# Patient Record
Sex: Female | Born: 1995 | Race: Black or African American | Hispanic: No | Marital: Single | Smoking: Never smoker
Health system: Southern US, Community
[De-identification: ages and names within clinical notes are randomized; demographics above are authoritative.]

## PROBLEM LIST (undated history)

## (undated) DIAGNOSIS — R040 Epistaxis: Secondary | ICD-10-CM

## (undated) DIAGNOSIS — Z9109 Other allergy status, other than to drugs and biological substances: Secondary | ICD-10-CM

## (undated) HISTORY — PX: TONSILLECTOMY: SUR1361

## (undated) HISTORY — PX: ADENOIDECTOMY: SUR15

---
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
## 2014-01-29 ENCOUNTER — Emergency Department (HOSPITAL_COMMUNITY): Payer: Medicaid Other

## 2014-01-29 ENCOUNTER — Emergency Department (HOSPITAL_COMMUNITY)
Admission: EM | Admit: 2014-01-29 | Discharge: 2014-01-29 | Disposition: A | Discharge status: Home / Self Care | Payer: Medicaid Other | Attending: Emergency Medicine | Admitting: Emergency Medicine

## 2014-01-29 ENCOUNTER — Encounter (HOSPITAL_COMMUNITY): Payer: Self-pay | Admitting: Emergency Medicine

## 2014-01-29 DIAGNOSIS — R05 Cough: Secondary | ICD-10-CM | POA: Insufficient documentation

## 2014-01-29 DIAGNOSIS — Z79899 Other long term (current) drug therapy: Secondary | ICD-10-CM | POA: Insufficient documentation

## 2014-01-29 DIAGNOSIS — R059 Cough, unspecified: Secondary | ICD-10-CM | POA: Diagnosis not present

## 2014-01-29 DIAGNOSIS — R0789 Other chest pain: Secondary | ICD-10-CM | POA: Diagnosis not present

## 2014-01-29 DIAGNOSIS — R079 Chest pain, unspecified: Secondary | ICD-10-CM | POA: Diagnosis present

## 2014-01-29 DIAGNOSIS — Z3202 Encounter for pregnancy test, result negative: Secondary | ICD-10-CM | POA: Diagnosis not present

## 2014-01-29 LAB — BASIC METABOLIC PANEL
Anion gap: 13 (ref 5–15)
BUN: 12 mg/dL (ref 6–23)
CALCIUM: 9.6 mg/dL (ref 8.4–10.5)
CO2: 23 mEq/L (ref 19–32)
CREATININE: 0.77 mg/dL (ref 0.50–1.10)
Chloride: 103 mEq/L (ref 96–112)
GFR calc Af Amer: >90 mL/min (ref >90)
GLUCOSE: 84 mg/dL (ref 70–99)
Potassium: 3.9 mEq/L (ref 3.7–5.3)
SODIUM: 139 meq/L (ref 137–147)

## 2014-01-29 LAB — CBC WITH DIFFERENTIAL/PLATELET
Basophils Absolute: 0 10*3/uL (ref 0.0–0.1)
Basophils Relative: 0 % (ref 0–1)
EOS ABS: 0.2 10*3/uL (ref 0.0–0.7)
EOS PCT: 3 % (ref 0–5)
HCT: 40.2 % (ref 36.0–46.0)
Hemoglobin: 13.4 g/dL (ref 12.0–15.0)
LYMPHS ABS: 1.9 10*3/uL (ref 0.7–4.0)
Lymphocytes Relative: 32 % (ref 12–46)
MCH: 28.5 pg (ref 26.0–34.0)
MCHC: 33.3 g/dL (ref 30.0–36.0)
MCV: 85.5 fL (ref 78.0–100.0)
MONOS PCT: 10 % (ref 3–12)
Monocytes Absolute: 0.6 10*3/uL (ref 0.1–1.0)
Neutro Abs: 3.2 10*3/uL (ref 1.7–7.7)
Neutrophils Relative %: 55 % (ref 43–77)
PLATELETS: 176 10*3/uL (ref 150–400)
RBC: 4.7 MIL/uL (ref 3.87–5.11)
RDW: 12.2 % (ref 11.5–15.5)
WBC: 5.8 10*3/uL (ref 4.0–10.5)

## 2014-01-29 LAB — D-DIMER, QUANTITATIVE: D-Dimer, Quant: 0.28 ug/mL-FEU (ref 0.00–0.48)

## 2014-01-29 LAB — TROPONIN I: Troponin I: <0.3 ng/mL (ref <0.30)

## 2014-01-29 LAB — POC URINE PREG, ED: PREG TEST UR: NEGATIVE

## 2014-01-29 MED ORDER — IBUPROFEN 400 MG PO TABS: 400.0000 mg | ORAL_TABLET | Freq: Four times a day (QID) | ORAL | Status: DC | PRN

## 2014-01-29 NOTE — Discharge Instructions (Signed)
We saw you in the ER for the chest pain/shortness of breath. All of our cardiac workup is normal, including labs, EKG and chest X-RAY do not show any emergent condition. We are not sure what is causing your discomfort, but we feel comfortable sending you home at this time. The workup in the ER is not complete, and you should follow up with your primary care doctor for further evaluation.   Chest Pain (Nonspecific) It is often hard to give a specific diagnosis for the cause of chest pain. There is always a chance that your pain could be related to something serious, such as a heart attack or a blood clot in the lungs. You need to follow up with your health care provider for further evaluation. CAUSES   Heartburn.  Pneumonia or bronchitis.  Anxiety or stress.  Inflammation around your heart (pericarditis) or lung (pleuritis or pleurisy).  A blood clot in the lung.  A collapsed lung (pneumothorax). It can develop suddenly on its own (spontaneous pneumothorax) or from trauma to the chest.  Shingles infection (herpes zoster virus). The chest wall is composed of bones, muscles, and cartilage. Any of these can be the source of the pain.  The bones can be bruised by injury.  The muscles or cartilage can be strained by coughing or overwork.  The cartilage can be affected by inflammation and become sore (costochondritis). DIAGNOSIS  Lab tests or other studies may be needed to find the cause of your pain. Your health care provider may have you take a test called an ambulatory electrocardiogram (ECG). An ECG records your heartbeat patterns over a 24-hour period. You may also have other tests, such as:  Transthoracic echocardiogram (TTE). During echocardiography, sound waves are used to evaluate how blood flows through your heart.  Transesophageal echocardiogram (TEE).  Cardiac monitoring. This allows your health care provider to monitor your heart rate and rhythm in real time.  Holter  monitor. This is a portable device that records your heartbeat and can help diagnose heart arrhythmias. It allows your health care provider to track your heart activity for several days, if needed.  Stress tests by exercise or by giving medicine that makes the heart beat faster. TREATMENT   Treatment depends on what may be causing your chest pain. Treatment may include:  Acid blockers for heartburn.  Anti-inflammatory medicine.  Pain medicine for inflammatory conditions.  Antibiotics if an infection is present.  You may be advised to change lifestyle habits. This includes stopping smoking and avoiding alcohol, caffeine, and chocolate.  You may be advised to keep your head raised (elevated) when sleeping. This reduces the chance of acid going backward from your stomach into your esophagus. Most of the time, nonspecific chest pain will improve within 2-3 days with rest and mild pain medicine.  HOME CARE INSTRUCTIONS   If antibiotics were prescribed, take them as directed. Finish them even if you start to feel better.  For the next few days, avoid physical activities that bring on chest pain. Continue physical activities as directed.  Do not use any tobacco products, including cigarettes, chewing tobacco, or electronic cigarettes.  Avoid drinking alcohol.  Only take medicine as directed by your health care provider.  Follow your health care provider's suggestions for further testing if your chest pain does not go away.  Keep any follow-up appointments you made. If you do not go to an appointment, you could develop lasting (chronic) problems with pain. If there is any problem keeping an appointment,  call to reschedule. SEEK MEDICAL CARE IF:   Your chest pain does not go away, even after treatment.  You have a rash with blisters on your chest.  You have a fever. SEEK IMMEDIATE MEDICAL CARE IF:   You have increased chest pain or pain that spreads to your arm, neck, jaw, back, or  abdomen.  You have shortness of breath.  You have an increasing cough, or you cough up blood.  You have severe back or abdominal pain.  You feel nauseous or vomit.  You have severe weakness.  You faint.  You have chills. This is an emergency. Do not wait to see if the pain will go away. Get medical help at once. Call your local emergency services (911 in [place] Do not drive yourself to the hospital. MAKE SURE YOU:   Understand these instructions.  Will watch your condition.  Will get help right away if you are not doing well or get worse. Document Released: 02/19/2005 Document Revised: 05/17/2013 Document Reviewed: 12/16/2007 [facility] Patient Information 2015 [place], [place]. This information is not intended to replace advice given to you by your health care provider. Make sure you discuss any questions you have with your health care provider.

## 2014-01-29 NOTE — ED Notes (Signed)
Patient transported to X-ray 

## 2014-01-29 NOTE — ED Notes (Signed)
Patient returned from X-ray 

## 2014-01-29 NOTE — ED Notes (Signed)
Pt reports mid chest pains last night and non productive cough yesterday. Having headache this am. No distress noted at triage. ekg done at triage.

## 2014-01-29 NOTE — ED Provider Notes (Addendum)
CSN: [id number]     Arrival date & time 01/29/14  0940 History   First MD Initiated Contact with Patient 01/29/14 [phone]     Chief Complaint  Patient presents with  . Chest Pain  . Cough     (Consider location/radiation/quality/duration/timing/severity/associated sxs/prior Treatment) HPI Comments: Pt comes in with cc of chest pain and non productive cough. Pain is sharp, intermittent, lasting for a few minutes and unprovoked, with no aggravating or relieving facts. Pain is left sided, non radiating. + cough. No dib, diaphoresis, exertional dyspnea. No hx of PE, DVT, and she has no risk factors for the same.   Patient is a 18 y.o. female presenting with chest pain and cough. The history is provided by the patient.  Chest Pain Associated symptoms: cough   Associated symptoms: no fever and no shortness of breath   Cough Associated symptoms: chest pain   Associated symptoms: no chills, no fever, no rash, no shortness of breath and no sore throat     Past Medical History  Diagnosis Date  . Nosebleed   . Environmental allergies    Past Surgical History  Procedure Laterality Date  . Tonsillectomy    . Adenoidectomy     Family History  Problem Relation Age of Onset  . Asthma Other   . Hypertension Other   . Cancer Other   . Asthma Mother    History  Substance Use Topics  . Smoking status: Never Smoker   . Smokeless tobacco: Never Used  . Alcohol Use: No   OB History   Grav Para Term Preterm Abortions TAB SAB Ect Mult Living       Review of Systems  Constitutional: Negative for fever and chills.  HENT: Negative for sore throat.   Respiratory: Positive for cough. Negative for shortness of breath.   Cardiovascular: Positive for chest pain.  Musculoskeletal: Negative for arthralgias.  Skin: Negative for rash.      Allergies  Other  Home Medications   Prior to Admission medications   Medication Sig Start Date End Date Taking? Authorizing  Provider  EPINEPHrine (EPIPEN IJ) Inject 1 Device as directed as needed (Emergent allergy--plants, animal fur).   Yes Historical Provider, MD  ibuprofen (ADVIL,MOTRIN) 200 MG tablet Take 400 mg by mouth every 6 (six) hours as needed.   Yes Historical Provider, MD  Multiple Vitamin (MULTIVITAMIN WITH MINERALS) TABS tablet Take 1 tablet by mouth daily.   Yes Historical Provider, MD  etonogestrel (IMPLANON) 68 MG IMPL implant Inject 1 each into the skin once. Implanted April, 2015 (lasts 3 years)    Historical Provider, MD  ibuprofen (ADVIL,MOTRIN) 400 MG tablet Take 1 tablet (400 mg total) by mouth every 6 (six) hours as needed. 01/29/14   Latravia Southgate, MD   BP 116/72  Pulse 73  Temp(Src) 97.8 F (36.6 C) (Oral)  Resp 18  SpO2 100% Physical Exam  Nursing note and vitals reviewed. Constitutional: She is oriented to person, place, and time. She appears well-developed and well-nourished.  HENT:  Head: Normocephalic and atraumatic.  Eyes: EOM are normal. Pupils are equal, round, and reactive to light.  Neck: Neck supple.  Cardiovascular: Normal rate, regular rhythm and normal heart sounds.   No murmur heard. Pulmonary/Chest: Effort normal. No respiratory distress.  Abdominal: Soft. She exhibits no distension. There is no tenderness. There is no rebound and no guarding.  Neurological: She is alert and oriented to person, place, and  time.  Skin: Skin is warm and dry.    ED Course  Procedures (including critical care time) Labs Review Labs Reviewed  CBC WITH DIFFERENTIAL  BASIC METABOLIC PANEL  TROPONIN I  D-DIMER, QUANTITATIVE  POC URINE PREG, ED    Imaging Review Dg Chest 2 View  01/29/2014   CLINICAL DATA:  Productive cough for 1 day with shortness of breath.  EXAM: CHEST  2 VIEW  COMPARISON:  None.  FINDINGS: Midline trachea. Normal heart size and mediastinal contours. No pleural effusion or pneumothorax. Clear lungs.  IMPRESSION: No acute cardiopulmonary disease.   Electronically  Signed   By: Jeronimo Greaves M.D.   On: 01/29/2014 10:59     EKG Interpretation   Date/Time:  Sunday January 29 2014 09:45:03 EDT Ventricular Rate:  69 PR Interval:  122 QRS Duration: 92 QT Interval:  370 QTC Calculation: 396 R Axis:   92 Text Interpretation:  Normal sinus rhythm with sinus arrhythmia Rightward  axis Early repolarization Borderline ECG No acute changes Confirmed by  Rhunette Croft, MD, Janey Genta [phone]) on 01/29/2014 9:57:49 AM      MDM   Final diagnoses:  Atypical chest pain    Differential diagnosis includes: ACS syndrome CHF exacerbation Valvular disorder Myocarditis Pericarditis Pericardial effusion Pneumonia Pleural effusion Pulmonary edema PE Musculoskeletal pain  Pt with intermittent chest pains. She has EKG findings of early repo, but with right axis some evidence of right sided heart strain. No new ekg changes. Has had chest pain and dib in the past as well. Pt is not PERC neg, as implanon is estrogen based and thus we will get a DIMER on her to screen for DVT.   Derwood Kaplan, MD 01/29/14 1114  Derwood Kaplan, MD 01/29/14 1156

## 2014-02-21 ENCOUNTER — Emergency Department (HOSPITAL_COMMUNITY)
Admission: EM | Admit: 2014-02-21 | Discharge: 2014-02-21 | Disposition: A | Discharge status: Home / Self Care | Payer: Medicaid Other | Attending: Emergency Medicine | Admitting: Emergency Medicine

## 2014-02-21 DIAGNOSIS — N898 Other specified noninflammatory disorders of vagina: Secondary | ICD-10-CM | POA: Diagnosis not present

## 2014-02-21 DIAGNOSIS — Z3202 Encounter for pregnancy test, result negative: Secondary | ICD-10-CM | POA: Diagnosis not present

## 2014-02-21 DIAGNOSIS — Z79899 Other long term (current) drug therapy: Secondary | ICD-10-CM | POA: Insufficient documentation

## 2014-02-21 DIAGNOSIS — N899 Noninflammatory disorder of vagina, unspecified: Secondary | ICD-10-CM | POA: Insufficient documentation

## 2014-02-21 DIAGNOSIS — N949 Unspecified condition associated with female genital organs and menstrual cycle: Secondary | ICD-10-CM | POA: Insufficient documentation

## 2014-02-21 LAB — URINALYSIS, ROUTINE W REFLEX MICROSCOPIC
Bilirubin Urine: NEGATIVE
Glucose, UA: NEGATIVE mg/dL
Hgb urine dipstick: NEGATIVE
Ketones, ur: NEGATIVE mg/dL
LEUKOCYTES UA: NEGATIVE
Nitrite: NEGATIVE
PH: 7.5 (ref 5.0–8.0)
PROTEIN: NEGATIVE mg/dL
Specific Gravity, Urine: 1.015 (ref 1.005–1.030)
Urobilinogen, UA: 1 mg/dL (ref 0.0–1.0)

## 2014-02-21 LAB — WET PREP, GENITAL
Clue Cells Wet Prep HPF POC: NONE SEEN
Trich, Wet Prep: NONE SEEN
Yeast Wet Prep HPF POC: NONE SEEN

## 2014-02-21 LAB — POC URINE PREG, ED: PREG TEST UR: NEGATIVE

## 2014-02-21 LAB — HIV ANTIBODY (ROUTINE TESTING W REFLEX): HIV 1&2 Ab, 4th Generation: NONREACTIVE

## 2014-02-21 LAB — RPR

## 2014-02-21 NOTE — ED Notes (Signed)
Pt with c/o vaginal irritation and discharge for 2 weeks, after unprotected sex. Whitish discharge, itching, no odor per patient

## 2014-02-21 NOTE — ED Provider Notes (Signed)
CSN: [id number]     Arrival date & time 02/21/14  4098 History   First MD Initiated Contact with Patient 02/21/14 0840     Chief Complaint  Patient presents with  . Vaginal Itching   Patient is a 18 y.o. female presenting with vaginal itching. The history is provided by the patient.  Vaginal Itching This is a new problem. The current episode started 1 to 4 weeks ago. The problem occurs constantly. The problem has been unchanged. Pertinent negatives include no abdominal pain, chest pain, fever, headaches, nausea, neck pain, rash, sore throat or vomiting. Nothing aggravates the symptoms. She has tried nothing for the symptoms.   Patient is a 18 yo AAF who presents with vaginal itching and irritation x 2 weeks. The patient is sexually active with one female partner and denies a h/o STI. She reports white vaginal bleeding every other day. Denies vaginal bleeding, dysuria, hematuria, fever, abdominal pain, N/V/D. LMP was approx 1 month ago and was normal for her. She has an implanon that was placed in April 2015.   Past Medical History  Diagnosis Date  . Nosebleed   . Environmental allergies    Past Surgical History  Procedure Laterality Date  . Tonsillectomy    . Adenoidectomy     Family History  Problem Relation Age of Onset  . Asthma Other   . Hypertension Other   . Cancer Other   . Asthma Mother    History  Substance Use Topics  . Smoking status: Never Smoker   . Smokeless tobacco: Never Used  . Alcohol Use: No   OB History   Grav Para Term Preterm Abortions TAB SAB Ect Mult Living   0 0 0 0 0 0 0 0 0 0      Review of Systems  Constitutional: Negative for fever.  HENT: Negative for rhinorrhea and sore throat.   Eyes: Negative for visual disturbance.  Respiratory: Negative for chest tightness and shortness of breath.   Cardiovascular: Negative for chest pain and palpitations.  Gastrointestinal: Negative for nausea, vomiting, abdominal pain and constipation.  Genitourinary:  Positive for vaginal discharge and vaginal pain. Negative for dysuria, hematuria, flank pain, menstrual problem and dyspareunia.       Vaginal itching  Musculoskeletal: Negative for back pain and neck pain.  Skin: Negative for rash.  Neurological: Negative for dizziness and headaches.  Psychiatric/Behavioral: Negative for confusion.  All other systems reviewed and are negative.  Allergies  Other  Home Medications   Prior to Admission medications   Medication Sig Start Date End Date Taking? Authorizing Provider  acetaminophen (TYLENOL) 500 MG tablet Take 1,000 mg by mouth every 8 (eight) hours as needed for headache.   Yes Historical Provider, MD  etonogestrel (IMPLANON) 68 MG IMPL implant Inject 1 each into the skin once. Implanted April, 2015 (lasts 3 years)   Yes Historical Provider, MD  Multiple Vitamin (MULTIVITAMIN WITH MINERALS) TABS tablet Take 1 tablet by mouth daily.   Yes Historical Provider, MD  PRESCRIPTION MEDICATION Apply 1 application topically as needed (vaginal irritation).   Yes Historical Provider, MD  EPINEPHrine (EPIPEN IJ) Inject 1 Device as directed as needed (Emergent allergy--plants, animal fur).    Historical Provider, MD   BP 111/55  Temp(Src) 98.6 F (37 C) (Oral)  Resp 17  SpO2 100% Physical Exam  Constitutional: She is oriented to person, place, and time. She appears well-developed and well-nourished. No distress.  HENT:  Head: Normocephalic and atraumatic.  Mouth/Throat: Oropharynx is clear  and moist.  Eyes: EOM are normal. Pupils are equal, round, and reactive to light.  Neck: Neck supple. No JVD present.  Cardiovascular: Normal rate, regular rhythm, normal heart sounds and intact distal pulses.  Exam reveals no gallop.   No murmur heard. Pulmonary/Chest: Effort normal and breath sounds normal. She has no wheezes. She has no rales.  Abdominal: Soft. She exhibits no distension. There is no tenderness.  Genitourinary: Vagina normal. There is no rash  or lesion on the right labia. There is no rash or lesion on the left labia. Cervix exhibits no motion tenderness. Discharge: thin yellow, thick white. Right adnexum displays no tenderness. Left adnexum displays no tenderness. No bleeding around the vagina.  Musculoskeletal: Normal range of motion. She exhibits no tenderness.  Neurological: She is alert and oriented to person, place, and time. No cranial nerve deficit. She exhibits normal muscle tone.  Skin: Skin is warm and dry. No rash noted.  Psychiatric: Her behavior is normal.   ED Course  Procedures  None   Labs Review Labs Reviewed  WET PREP, GENITAL - Abnormal; Notable for the following:    WBC, Wet Prep HPF POC FEW (*)    All other components within normal limits  GC/CHLAMYDIA PROBE AMP  RPR  HIV ANTIBODY (ROUTINE TESTING)  URINALYSIS, ROUTINE W REFLEX MICROSCOPIC  POC URINE PREG, ED    MDM   Final diagnoses:  Vaginal itching  Vaginal discharge   18 yo AAF presents with vaginal bleeding. AF, VSS, well appearing on exam. No dysuria, hematuria or abnormal vagianl bleeding. Abdomen soft, nontender. +Cervical discharge (white, yellow), no adnexal TTP, no CMT. Doubt PID, TOA, ovarian torsion, UTI/pyelo.UPT negative. Wet prep neg for yeast/trich/clue cells. Sent for HIV and RPR. UA unremarkable.  Instructed patient on safe sex practices and instructed her to f/u with her PCP and GYN. Patient voiced understanding and is agreeable with this plan.    Case discussed with Dr. Rubin PayorPickering.  Maris BergerJonah Zigmund Linse, MD   Maris BergerJonah Chizuko Trine, MD 02/21/14 [phone]

## 2014-02-22 LAB — GC/CHLAMYDIA PROBE AMP
CT PROBE, AMP APTIMA: NEGATIVE
GC Probe RNA: NEGATIVE

## 2014-02-22 NOTE — ED Provider Notes (Signed)
I saw and evaluated the patient, reviewed the resident's note and I agree with the findings and plan.   EKG Interpretation None     Vaginal itching. Pelvic done.   Juliet RudeNathan R. Rubin PayorPickering, MD 02/22/14 [phone]

## 2014-03-07 ENCOUNTER — Emergency Department (HOSPITAL_COMMUNITY)
Admission: EM | Admit: 2014-03-07 | Discharge: 2014-03-07 | Disposition: A | Discharge status: Home / Self Care | Payer: Medicaid Other | Attending: Emergency Medicine | Admitting: Emergency Medicine

## 2014-03-07 ENCOUNTER — Encounter (HOSPITAL_COMMUNITY): Payer: Self-pay | Admitting: Emergency Medicine

## 2014-03-07 ENCOUNTER — Emergency Department (HOSPITAL_COMMUNITY): Payer: Medicaid Other

## 2014-03-07 DIAGNOSIS — L739 Follicular disorder, unspecified: Secondary | ICD-10-CM | POA: Insufficient documentation

## 2014-03-07 DIAGNOSIS — Z3202 Encounter for pregnancy test, result negative: Secondary | ICD-10-CM | POA: Insufficient documentation

## 2014-03-07 DIAGNOSIS — R300 Dysuria: Secondary | ICD-10-CM | POA: Insufficient documentation

## 2014-03-07 DIAGNOSIS — R0602 Shortness of breath: Secondary | ICD-10-CM | POA: Insufficient documentation

## 2014-03-07 DIAGNOSIS — Z79899 Other long term (current) drug therapy: Secondary | ICD-10-CM | POA: Insufficient documentation

## 2014-03-07 DIAGNOSIS — N939 Abnormal uterine and vaginal bleeding, unspecified: Secondary | ICD-10-CM | POA: Diagnosis not present

## 2014-03-07 DIAGNOSIS — Z792 Long term (current) use of antibiotics: Secondary | ICD-10-CM | POA: Diagnosis not present

## 2014-03-07 DIAGNOSIS — R079 Chest pain, unspecified: Secondary | ICD-10-CM | POA: Diagnosis present

## 2014-03-07 LAB — BASIC METABOLIC PANEL
ANION GAP: 11 (ref 5–15)
BUN: 11 mg/dL (ref 6–23)
CALCIUM: 9.3 mg/dL (ref 8.4–10.5)
CO2: 25 mEq/L (ref 19–32)
Chloride: 104 mEq/L (ref 96–112)
Creatinine, Ser: 0.77 mg/dL (ref 0.50–1.10)
Glucose, Bld: 72 mg/dL (ref 70–99)
POTASSIUM: 4 meq/L (ref 3.7–5.3)
Sodium: 140 mEq/L (ref 137–147)

## 2014-03-07 LAB — URINALYSIS, ROUTINE W REFLEX MICROSCOPIC
Bilirubin Urine: NEGATIVE
Glucose, UA: NEGATIVE mg/dL
Ketones, ur: 15 mg/dL — AB
Leukocytes, UA: NEGATIVE
Nitrite: NEGATIVE
Protein, ur: NEGATIVE mg/dL
Specific Gravity, Urine: 1.026 (ref 1.005–1.030)
Urobilinogen, UA: 1 mg/dL (ref 0.0–1.0)
pH: 6 (ref 5.0–8.0)

## 2014-03-07 LAB — CBC
HCT: 39.3 % (ref 36.0–46.0)
HEMOGLOBIN: 13.1 g/dL (ref 12.0–15.0)
MCH: 27.8 pg (ref 26.0–34.0)
MCHC: 33.3 g/dL (ref 30.0–36.0)
MCV: 83.3 fL (ref 78.0–100.0)
PLATELETS: 213 10*3/uL (ref 150–400)
RBC: 4.72 MIL/uL (ref 3.87–5.11)
RDW: 12 % (ref 11.5–15.5)
WBC: 4.5 10*3/uL (ref 4.0–10.5)

## 2014-03-07 LAB — I-STAT TROPONIN, ED: TROPONIN I, POC: 0 ng/mL (ref 0.00–0.08)

## 2014-03-07 LAB — URINE MICROSCOPIC-ADD ON

## 2014-03-07 LAB — WET PREP, GENITAL
Clue Cells Wet Prep HPF POC: NONE SEEN
Trich, Wet Prep: NONE SEEN
Yeast Wet Prep HPF POC: NONE SEEN

## 2014-03-07 LAB — PREGNANCY, URINE: Preg Test, Ur: NEGATIVE

## 2014-03-07 MED ORDER — MUPIROCIN CALCIUM 2 % EX CREA: 1.0000 "application " | TOPICAL_CREAM | Freq: Two times a day (BID) | CUTANEOUS | Status: AC

## 2014-03-07 NOTE — ED Notes (Signed)
Pt from home for eval of multiple complaints, pt states HA that started x1 week ago, pt states she has seen her eye doctor and is suppose to be getting new glasses. Pt also reports cp that started today, no radiation, pt denies and emesis or diarrhea. Pt also reports vaginal irritation that pt was recently seen for and reports is not getting any better. Pt denies any sexual intercourse currently. nad noted. Lung sounds clear.

## 2014-03-07 NOTE — Discharge Instructions (Signed)

## 2014-03-07 NOTE — ED Provider Notes (Addendum)
CSN: [id number]     Arrival date & time 03/07/14  1734 History   First MD Initiated Contact with Patient 03/07/14 1956     Chief Complaint  Patient presents with  . Chest Pain     (Consider location/radiation/quality/duration/timing/severity/associated sxs/prior Treatment) HPI Comments: Patient presents with chest pain and vaginal pain. She reports since yesterday she's had some intermittent tightness to the left side of her chest. She denies any shortness of breath. She says her abdomen is been hurting all day. On further questioning she's had nausea but no abdominal pain or vomiting. She denies any diarrhea. She denies he fevers or chills. She denies any diaphoresis or dizziness. She does have some burning around her vaginal area when she urinates. She denies any vaginal discharge. She is sexually active and has no history of STDs.  Patient is a 18 y.o. female presenting with chest pain.  Chest Pain Associated symptoms: nausea and shortness of breath   Associated symptoms: no abdominal pain, no back pain, no cough, no diaphoresis, no dizziness, no fatigue, no fever, no headache, no numbness, not vomiting and no weakness     Past Medical History  Diagnosis Date  . Nosebleed   . Environmental allergies    Past Surgical History  Procedure Laterality Date  . Tonsillectomy    . Adenoidectomy     Family History  Problem Relation Age of Onset  . Asthma Other   . Hypertension Other   . Cancer Other   . Asthma Mother    History  Substance Use Topics  . Smoking status: Never Smoker   . Smokeless tobacco: Never Used  . Alcohol Use: No   OB History   Grav Para Term Preterm Abortions TAB SAB Ect Mult Living   0 0 0 0 0 0 0 0 0 0      Review of Systems  Constitutional: Negative for fever, chills, diaphoresis and fatigue.  HENT: Negative for congestion, rhinorrhea and sneezing.   Eyes: Negative.   Respiratory: Positive for shortness of breath. Negative for cough and chest  tightness.   Cardiovascular: Positive for chest pain. Negative for leg swelling.  Gastrointestinal: Positive for nausea. Negative for vomiting, abdominal pain, diarrhea and blood in stool.  Genitourinary: Positive for dysuria and vaginal pain. Negative for frequency, hematuria, flank pain and difficulty urinating.  Musculoskeletal: Negative for arthralgias and back pain.  Skin: Negative for rash.  Neurological: Negative for dizziness, speech difficulty, weakness, numbness and headaches.      Allergies  Other  Home Medications   Prior to Admission medications   Medication Sig Start Date End Date Taking? Authorizing Provider  acetaminophen (TYLENOL) 500 MG tablet Take 1,000 mg by mouth every 8 (eight) hours as needed for headache.   Yes Historical Provider, MD  hydrOXYzine (VISTARIL) 25 MG capsule Take 25 mg by mouth every 6 (six) hours as needed for itching.   Yes Historical Provider, MD  ibuprofen (ADVIL,MOTRIN) 400 MG tablet Take 400 mg by mouth every 6 (six) hours as needed.   Yes Historical Provider, MD  Multiple Vitamin (MULTIVITAMIN WITH MINERALS) TABS tablet Take 1 tablet by mouth daily.   Yes Historical Provider, MD  EPINEPHrine (EPIPEN IJ) Inject 1 Device as directed as needed (Emergent allergy--plants, animal fur).    Historical Provider, MD  etonogestrel (IMPLANON) 68 MG IMPL implant Inject 1 each into the skin once. Implanted April, 2015 (lasts 3 years)    Historical Provider, MD  mupirocin cream (BACTROBAN) 2 % Apply 1 application  topically 2 (two) times daily. 03/07/14   Rolan BuccoMelanie Kesley Mullens, MD   BP 110/69  Pulse 73  Temp(Src) 98.4 F (36.9 C) (Oral)  Resp 18  SpO2 95% Physical Exam  Constitutional: She is oriented to person, place, and time. She appears well-developed and well-nourished.  HENT:  Head: Normocephalic and atraumatic.  Eyes: Pupils are equal, round, and reactive to light.  Neck: Normal range of motion. Neck supple.  Cardiovascular: Normal rate, regular  rhythm and normal heart sounds.   Pulmonary/Chest: Effort normal and breath sounds normal. No respiratory distress. She has no wheezes. She has no rales. She exhibits no tenderness.  Abdominal: Soft. Bowel sounds are normal. There is no tenderness. There is no rebound and no guarding.  Genitourinary: No vaginal discharge found.  Small amount of vaginal bleeding.  No discharge.  No CMT or adnexal tenderness.  No skin lesions, sores, or vesicles.  Musculoskeletal: Normal range of motion. She exhibits no edema.  Lymphadenopathy:    She has no cervical adenopathy.  Neurological: She is alert and oriented to person, place, and time.  Skin: Skin is warm and dry. No rash noted.  Psychiatric: She has a normal mood and affect.    ED Course  Procedures (including critical care time) Labs Review Labs Reviewed  WET PREP, GENITAL - Abnormal; Notable for the following:    WBC, Wet Prep HPF POC FEW (*)    All other components within normal limits  URINALYSIS, ROUTINE W REFLEX MICROSCOPIC - Abnormal; Notable for the following:    Hgb urine dipstick LARGE (*)    Ketones, ur 15 (*)    All other components within normal limits  GC/CHLAMYDIA PROBE AMP  CBC  BASIC METABOLIC PANEL  PREGNANCY, URINE  URINE MICROSCOPIC-ADD ON  Rosezena SensorI-STAT TROPOININ, ED    Imaging Review Dg Chest 2 View  03/07/2014   CLINICAL DATA:  Chest pain for 3 days.  EXAM: CHEST  2 VIEW  COMPARISON:  01/29/2014.  FINDINGS: Cardiopericardial silhouette within normal limits. Mediastinal contours normal. Trachea midline. No airspace disease or effusion.  IMPRESSION: No active cardiopulmonary disease.   Electronically Signed   By: Andreas NewportGeoffrey  Lamke M.D.   On: 03/07/2014 18:57     EKG Interpretation   Date/Time:  Tuesday March 07 2014 17:47:52 EDT Ventricular Rate:  73 PR Interval:  116 QRS Duration: 92 QT Interval:  376 QTC Calculation: 414 R Axis:   89 Text Interpretation:  Normal sinus rhythm with sinus arrhythmia Normal ECG   since last tracing no significant change Confirmed by Toshiro Hanken  MD, Sary Bogie  ([id number]) on 03/07/2014 8:14:10 PM      MDM   Final diagnoses:  Folliculitis    Pt is well appearing.  She is eating [employer]-fil[employer]a in the room.  No abd pain on exam.  preg negative.  Pelvic exam unremarkable.  She complains of burning to her perineal area.  I don't see evidence of Herpes, no yeast, no vaginal discharge.  There is maybe a small amount of folliculitis where she has been shaving.  Advised her to stop shaving there and gave her a rx for bactroban ointment.  Advised pt to f/u with her PMD if symptoms not improving.  Her EKG does not show arrhythmia.  Her CXR is unremarkable.  She has no CP currently.    Rolan BuccoMelanie Elber Galyean, MD 03/07/14 [id number]  Rolan BuccoMelanie Marializ Ferrebee, MD 03/07/14 2308

## 2014-03-07 NOTE — ED Notes (Signed)
MD and Toni Amendourtney, NT at the bedside preforming pelvic exam.

## 2014-03-07 NOTE — ED Notes (Signed)
Pt reports chest tightness over the past 2 days with some nausea. No other symptoms. No hx of asthma. Also reports some perineal burning when she voids and it runs down.

## 2014-03-08 LAB — GC/CHLAMYDIA PROBE AMP
CT Probe RNA: NEGATIVE
GC Probe RNA: NEGATIVE

## 2014-04-29 ENCOUNTER — Emergency Department (HOSPITAL_COMMUNITY)
Admission: EM | Admit: 2014-04-29 | Discharge: 2014-04-29 | Disposition: A | Discharge status: Home / Self Care | Payer: Medicaid Other | Attending: Emergency Medicine | Admitting: Emergency Medicine

## 2014-04-29 ENCOUNTER — Encounter (HOSPITAL_COMMUNITY): Payer: Self-pay | Admitting: Emergency Medicine

## 2014-04-29 DIAGNOSIS — Z79899 Other long term (current) drug therapy: Secondary | ICD-10-CM | POA: Diagnosis not present

## 2014-04-29 DIAGNOSIS — Z3202 Encounter for pregnancy test, result negative: Secondary | ICD-10-CM | POA: Insufficient documentation

## 2014-04-29 DIAGNOSIS — N390 Urinary tract infection, site not specified: Secondary | ICD-10-CM | POA: Diagnosis not present

## 2014-04-29 DIAGNOSIS — N898 Other specified noninflammatory disorders of vagina: Secondary | ICD-10-CM | POA: Diagnosis present

## 2014-04-29 LAB — WET PREP, GENITAL
Trich, Wet Prep: NONE SEEN
Yeast Wet Prep HPF POC: NONE SEEN

## 2014-04-29 LAB — URINALYSIS, ROUTINE W REFLEX MICROSCOPIC
Bilirubin Urine: NEGATIVE
Glucose, UA: NEGATIVE mg/dL
KETONES UR: NEGATIVE mg/dL
Nitrite: NEGATIVE
Protein, ur: NEGATIVE mg/dL
SPECIFIC GRAVITY, URINE: 1.024 (ref 1.005–1.030)
Urobilinogen, UA: 1 mg/dL (ref 0.0–1.0)
pH: 6 (ref 5.0–8.0)

## 2014-04-29 LAB — URINE MICROSCOPIC-ADD ON

## 2014-04-29 LAB — POC URINE PREG, ED: PREG TEST UR: NEGATIVE

## 2014-04-29 MED ORDER — CEPHALEXIN 500 MG PO CAPS: 1000.0000 mg | ORAL_CAPSULE | Freq: Two times a day (BID) | ORAL | Status: AC

## 2014-04-29 NOTE — Discharge Instructions (Signed)
Asymptomatic Bacteriuria Asymptomatic bacteriuria is the presence of a large number of bacteria in your urine without the usual symptoms of burning or frequent urination. The following conditions increase the risk of asymptomatic bacteriuria:  Diabetes mellitus.  Advanced age.  Pregnancy in the first trimester.  Kidney stones.  Kidney transplants.  Leaky kidney tube valve in young children (reflux). Treatment for this condition is not needed in most people and can lead to other problems such as too much yeast and growth of resistant bacteria. However, some people, such as pregnant women, do need treatment to prevent kidney infection. Asymptomatic bacteriuria in pregnancy is also associated with fetal growth restriction, premature labor, and newborn death. HOME CARE INSTRUCTIONS Monitor your condition for any changes. The following actions may help to relieve any discomfort you are feeling:  Drink enough water and fluids to keep your urine clear or pale yellow. Go to the bathroom more often to keep your bladder empty.  Keep the area around your vagina and rectum clean. Wipe yourself from front to back after urinating. SEEK IMMEDIATE MEDICAL CARE IF:  You develop signs of an infection such as:  Burning with urination.  Frequency of voiding.  Back pain.  Fever.  You have blood in the urine.  You develop a fever. MAKE SURE YOU:  Understand these instructions.  Will watch your condition.  Will get help right away if you are not doing well or get worse. Document Released: 05/12/2005 Document Revised: 09/26/2013 Document Reviewed: 11/01/2012 [employer] Patient Information 2015 [employer]. This information is not intended to replace advice given to you by your health care provider. Make sure you discuss any questions you have with your health care provider.  

## 2014-04-29 NOTE — ED Provider Notes (Signed)
CSN: [id number]     Arrival date & time 04/29/14  0945 History   First MD Initiated Contact with Patient 04/29/14 [id number]     Chief Complaint  Patient presents with  . Vaginal Discharge  . Vaginal Itching     (Consider location/radiation/quality/duration/timing/severity/associated sxs/prior Treatment) HPI   18 year old female presents with complaints of vaginal discomfort. Patient reports for the past 4 days she has had vaginal irritation including itchiness and burning sensation that has been persistent. There is no associated fever, chills, abdominal pain, back pain, dysuria, hematuria, rash or any significant vaginal discharge. No prior history of STD. Patient states she is currently not sexually active. She also reported having similar symptoms like this several weeks ago and was treated with medication that did provide relief. She denies douching or any change in soap or detergent. She cannot recall her last menstrual period she has an Implanon.  Past Medical History  Diagnosis Date  . Nosebleed   . Environmental allergies    Past Surgical History  Procedure Laterality Date  . Tonsillectomy    . Adenoidectomy     Family History  Problem Relation Age of Onset  . Asthma Other   . Hypertension Other   . Cancer Other   . Asthma Mother    History  Substance Use Topics  . Smoking status: Never Smoker   . Smokeless tobacco: Never Used  . Alcohol Use: No   OB History    Gravida Para Term Preterm AB TAB SAB Ectopic Multiple Living   0 0 0 0 0 0 0 0 0 0      Review of Systems  Constitutional: Negative for fever.  Gastrointestinal: Negative for abdominal pain.  Genitourinary: Positive for genital sores. Negative for dysuria and vaginal discharge.  Skin: Negative for rash.      Allergies  Other  Home Medications   Prior to Admission medications   Medication Sig Start Date End Date Taking? Authorizing Provider  acetaminophen (TYLENOL) 500 MG tablet Take 1,000 mg by  mouth every 8 (eight) hours as needed for headache.    Historical Provider, MD  EPINEPHrine (EPIPEN IJ) Inject 1 Device as directed as needed (Emergent allergy--plants, animal fur).    Historical Provider, MD  etonogestrel (IMPLANON) 68 MG IMPL implant Inject 1 each into the skin once. Implanted April, 2015 (lasts 3 years)    Historical Provider, MD  hydrOXYzine (VISTARIL) 25 MG capsule Take 25 mg by mouth every 6 (six) hours as needed for itching.    Historical Provider, MD  ibuprofen (ADVIL,MOTRIN) 400 MG tablet Take 400 mg by mouth every 6 (six) hours as needed.    Historical Provider, MD  Multiple Vitamin (MULTIVITAMIN WITH MINERALS) TABS tablet Take 1 tablet by mouth daily.    Historical Provider, MD  mupirocin cream (BACTROBAN) 2 % Apply 1 application topically 2 (two) times daily. 03/07/14   Rolan BuccoMelanie Belfi, MD   BP 109/60 mmHg  Pulse 87  Temp(Src) 98.4 F (36.9 C) (Oral)  Resp 16  Ht 5' (1.524 m)  Wt 122 lb (55.339 kg)  BMI 23.83 kg/m2  SpO2 100% Physical Exam  Constitutional: She appears well-developed and well-nourished. No distress.  HENT:  Head: Atraumatic.  Eyes: Conjunctivae are normal.  Neck: Neck supple.  Genitourinary:  Chaperone present:  No inguinal lymphadenopathy, normal external genitalia, no significant discomfort with speculum insertion. Vaginal vault with moderate amount of white discharge, so the cause is visualized and closed. On bimanual examination, no adnexal tenderness and no cervical  motion tenderness.  Neurological: She is alert.  Skin: No rash noted.  Psychiatric: She has a normal mood and affect.  Nursing note and vitals reviewed.   ED Course  Procedures (including critical care time)  12:39 PM Patient complaining of vaginal irritation and occasional urinary discomfort. Wet prep shows moderate WBC and few clue cells. No evidence of yeast infection and no evidence to support bacterial vaginosis. No evidence to suggest PID, Prevacid test is  negative. UA shows blood and urine and evidence of a bacterial infection. Given the symptoms, patient will be treated for urinary tract infection. GC and chlamydia culture sent patient will be notified if tested positive. On reexamination patient has no abdominal tenderness and she felt comfortable being discharged. Return precautions discussed.  Labs Review Labs Reviewed  WET PREP, GENITAL - Abnormal; Notable for the following:    Clue Cells Wet Prep HPF POC FEW (*)    WBC, Wet Prep HPF POC MODERATE (*)    All other components within normal limits  URINALYSIS, ROUTINE W REFLEX MICROSCOPIC - Abnormal; Notable for the following:    APPearance CLOUDY (*)    Hgb urine dipstick LARGE (*)    Leukocytes, UA LARGE (*)    All other components within normal limits  URINE MICROSCOPIC-ADD ON - Abnormal; Notable for the following:    Squamous Epithelial / LPF MANY (*)    Bacteria, UA MANY (*)    All other components within normal limits  GC/CHLAMYDIA PROBE AMP  POC URINE PREG, ED    Imaging Review No results found.   EKG Interpretation None      MDM   Final diagnoses:  UTI (lower urinary tract infection)    BP 115/71 mmHg  Pulse 69  Temp(Src) 98.4 F (36.9 C) (Oral)  Resp 15  Ht 5' (1.524 m)  Wt 122 lb (55.339 kg)  BMI 23.83 kg/m2  SpO2 100%     Fayrene HelperBowie Brenley Priore, PA-C 04/29/14 1240  Flint MelterElliott L Wentz, MD 04/29/14 1751

## 2014-04-29 NOTE — ED Notes (Signed)
Pt. Stated, I've had some itching and burning since Wednesday in my vagina

## 2014-05-01 LAB — GC/CHLAMYDIA PROBE AMP
CT Probe RNA: NEGATIVE
GC PROBE AMP APTIMA: NEGATIVE

## 2014-07-04 ENCOUNTER — Emergency Department (HOSPITAL_COMMUNITY)
Admission: EM | Admit: 2014-07-04 | Discharge: 2014-07-04 | Disposition: A | Discharge status: Home / Self Care | Payer: Medicaid Other | Attending: Emergency Medicine | Admitting: Emergency Medicine

## 2014-07-04 ENCOUNTER — Encounter (HOSPITAL_COMMUNITY): Payer: Self-pay | Admitting: Emergency Medicine

## 2014-07-04 DIAGNOSIS — Z792 Long term (current) use of antibiotics: Secondary | ICD-10-CM | POA: Diagnosis not present

## 2014-07-04 DIAGNOSIS — B373 Candidiasis of vulva and vagina: Secondary | ICD-10-CM | POA: Insufficient documentation

## 2014-07-04 DIAGNOSIS — Z793 Long term (current) use of hormonal contraceptives: Secondary | ICD-10-CM | POA: Insufficient documentation

## 2014-07-04 DIAGNOSIS — Z3202 Encounter for pregnancy test, result negative: Secondary | ICD-10-CM | POA: Diagnosis not present

## 2014-07-04 DIAGNOSIS — Z711 Person with feared health complaint in whom no diagnosis is made: Secondary | ICD-10-CM

## 2014-07-04 DIAGNOSIS — B3731 Acute candidiasis of vulva and vagina: Secondary | ICD-10-CM

## 2014-07-04 DIAGNOSIS — Z202 Contact with and (suspected) exposure to infections with a predominantly sexual mode of transmission: Secondary | ICD-10-CM | POA: Insufficient documentation

## 2014-07-04 LAB — WET PREP, GENITAL
CLUE CELLS WET PREP: NONE SEEN
Trich, Wet Prep: NONE SEEN

## 2014-07-04 LAB — URINALYSIS, ROUTINE W REFLEX MICROSCOPIC
Bilirubin Urine: NEGATIVE
GLUCOSE, UA: NEGATIVE mg/dL
Ketones, ur: NEGATIVE mg/dL
NITRITE: NEGATIVE
Protein, ur: NEGATIVE mg/dL
Specific Gravity, Urine: 1.019 (ref 1.005–1.030)
Urobilinogen, UA: 1 mg/dL (ref 0.0–1.0)
pH: 5.5 (ref 5.0–8.0)

## 2014-07-04 LAB — URINE MICROSCOPIC-ADD ON

## 2014-07-04 LAB — POC URINE PREG, ED: Preg Test, Ur: NEGATIVE

## 2014-07-04 MED ORDER — CEFTRIAXONE SODIUM 250 MG IJ SOLR
250.0000 mg | Freq: Once | INTRAMUSCULAR | Status: AC
  Administered 2014-07-04: 250 mg via INTRAMUSCULAR
  Filled 2014-07-04: qty 250

## 2014-07-04 MED ORDER — AZITHROMYCIN 250 MG PO TABS
1000.0000 mg | ORAL_TABLET | Freq: Once | ORAL | Status: AC
  Administered 2014-07-04: 1000 mg via ORAL
  Filled 2014-07-04: qty 4

## 2014-07-04 MED ORDER — FLUCONAZOLE 100 MG PO TABS
150.0000 mg | ORAL_TABLET | Freq: Once | ORAL | Status: AC
  Administered 2014-07-04: 150 mg via ORAL
  Filled 2014-07-04: qty 2

## 2014-07-04 NOTE — Discharge Instructions (Signed)
[facility]D concerns or screenings. This is the recommendation by the CDC for people with multiple sexual partners or hx of STDs. You have been treated for gonorrhea and chlamydia in the ER but the hospital will call you if lab is positive. You were tested for HIV and Syphilis, and the hospital will call you if the lab is positive. You were found to have yeast infection of the vagina but you were treated today and don't need further treatment.

## 2014-07-04 NOTE — ED Provider Notes (Signed)
CSN: [id number]     Arrival date & time 07/04/14  1114 History  This chart was scribed for [name], PA-C working with Rolland PorterMark James, MD by Elveria Risingimelie Horne, ED Scribe. This patient was seen in room TR11C/TR11C and the patient's care was started at 2:07 PM.   Chief Complaint  Patient presents with  . Exposure to STD   Patient is a 19 y.o. female presenting with STD exposure. The history is provided by the patient. No language interpreter was used.  Exposure to STD This is a new problem. The current episode started more than 1 week ago. The problem occurs constantly. The problem has not changed since onset.Pertinent negatives include no chest pain, no abdominal pain and no shortness of breath. Nothing aggravates the symptoms. Nothing relieves the symptoms. She has tried nothing for the symptoms. The treatment provided no relief.   HPI Comments: Morgan Wiley is a 19 y.o. healthy female who presents to the Emergency Department requesting STD screening for suspected STD exposure. Patient states that her sexual partner informed her that she may been exposed, because he's recently tested positive. Patient is currently asymptomatic; patient specifically denies vaginal pain, vaginal discharge, vaginal itching, vaginal lesions, dysuria, hematuria, abdominal pain, or other symptoms. Patient reports unprotected sexual intercourse with a single female partner. Patient reports that her period began today therefore she just began having vaginal bleeding.    Past Medical History  Diagnosis Date  . Nosebleed   . Environmental allergies    Past Surgical History  Procedure Laterality Date  . Tonsillectomy    . Adenoidectomy     Family History  Problem Relation Age of Onset  . Asthma Other   . Hypertension Other   . Cancer Other   . Asthma Mother    History  Substance Use Topics  . Smoking status: Never Smoker   . Smokeless tobacco: Never Used  . Alcohol Use: No   OB History    Gravida Para  Term Preterm AB TAB SAB Ectopic Multiple Living   0 0 0 0 0 0 0 0 0 0      Review of Systems  Constitutional: Negative for fever and chills.  Respiratory: Negative for shortness of breath.   Cardiovascular: Negative for chest pain.  Gastrointestinal: Negative for nausea, vomiting, abdominal pain, diarrhea and constipation.  Genitourinary: Positive for vaginal bleeding (on menses). Negative for dysuria, hematuria, vaginal discharge, vaginal pain and pelvic pain.  Musculoskeletal: Negative for myalgias and back pain.  Skin: Negative for rash.  Allergic/Immunologic: Negative for immunocompromised state.  Neurological: Negative for weakness and numbness.  A complete 10 system review of systems was obtained and all systems are negative except as noted in the HPI and PMH.    Allergies  Other  Home Medications   Prior to Admission medications   Medication Sig Start Date End Date Taking? Authorizing Provider  acetaminophen (TYLENOL) 500 MG tablet Take 1,000 mg by mouth every 8 (eight) hours as needed for headache.    Historical Provider, MD  cephALEXin (KEFLEX) 500 MG capsule Take 2 capsules (1,000 mg total) by mouth 2 (two) times daily. 04/29/14   Fayrene HelperBowie Tran, PA-C  EPINEPHrine (EPIPEN IJ) Inject 1 Device as directed as needed (Emergent allergy--plants, animal fur).    Historical Provider, MD  etonogestrel (IMPLANON) 68 MG IMPL implant Inject 1 each into the skin once. Implanted April, 2015 (lasts 3 years)    Historical Provider, MD  hydrOXYzine (VISTARIL) 25 MG capsule Take 25 mg by mouth  every 6 (six) hours as needed for itching.    Historical Provider, MD  ibuprofen (ADVIL,MOTRIN) 400 MG tablet Take 400 mg by mouth every 6 (six) hours as needed.    Historical Provider, MD  Multiple Vitamin (MULTIVITAMIN WITH MINERALS) TABS tablet Take 1 tablet by mouth daily.    Historical Provider, MD  mupirocin cream (BACTROBAN) 2 % Apply 1 application topically 2 (two) times daily. Patient not taking:  Reported on 04/29/2014 03/07/14   Rolan Bucco, MD   Triage Vitals: BP 113/75 mmHg  Pulse 76  Temp(Src) 98.1 F (36.7 C) (Oral)  Resp 19  SpO2 100% Physical Exam  Constitutional: She is oriented to person, place, and time. Vital signs are normal. She appears well-developed and well-nourished.  Non-toxic appearance. No distress.  Afebrile nontoxic NAD  HENT:  Head: Normocephalic and atraumatic.  Mouth/Throat: Mucous membranes are normal.  Eyes: Conjunctivae and EOM are normal. Right eye exhibits no discharge. Left eye exhibits no discharge.  Neck: Normal range of motion. Neck supple.  Cardiovascular: Normal rate.   Pulmonary/Chest: Effort normal. No respiratory distress.  Abdominal: Soft. Normal appearance and bowel sounds are normal. She exhibits no distension. There is no tenderness. There is no rigidity, no rebound, no guarding, no CVA tenderness, no tenderness at McBurney's point and negative Murphy's sign.  Soft, NTND, +BS throughout, no r/g/r, neg murphy's, neg mcburney's, no CVA TTP   Genitourinary: Uterus normal. Pelvic exam was performed with patient supine. There is no rash, tenderness or lesion on the right labia. There is no rash, tenderness or lesion on the left labia. Cervix exhibits discharge (mild clear discharge at os). Cervix exhibits no motion tenderness and no friability. Right adnexum displays no mass, no tenderness and no fullness. Left adnexum displays no mass, no tenderness and no fullness. There is bleeding in the vagina. No erythema or tenderness in the vagina. No vaginal discharge found.  Chaperone present for exam. No rashes, lesions, or tenderness to external genitalia. No erythema, injury, or tenderness to vaginal mucosa. No vaginal discharge, menstrual bleeding within vaginal vault. No adnexal masses, tenderness, or fullness. No CMT or cervical friability. Mild clear discharge from cervical os. Uterus non-deviated, mobile, nonTTP, and without enlargement.     Musculoskeletal: Normal range of motion.  Neurological: She is alert and oriented to person, place, and time. She has normal strength. No sensory deficit.  Skin: Skin is warm, dry and intact. No rash noted.  Psychiatric: She has a normal mood and affect. Her behavior is normal.  Nursing note and vitals reviewed.   ED Course  Procedures (including critical care time)  COORDINATION OF CARE: 2:18 PM- Discussed treatment plan with patient at bedside and patient agreed to plan.   Labs Review Labs Reviewed  WET PREP, GENITAL - Abnormal; Notable for the following:    Yeast Wet Prep HPF POC FEW (*)    WBC, Wet Prep HPF POC TOO NUMEROUS TO COUNT (*)    All other components within normal limits  URINALYSIS, ROUTINE W REFLEX MICROSCOPIC - Abnormal; Notable for the following:    Hgb urine dipstick LARGE (*)    Leukocytes, UA MODERATE (*)    All other components within normal limits  URINE MICROSCOPIC-ADD ON  RPR  HIV ANTIBODY (ROUTINE TESTING)  POC URINE PREG, ED  GC/CHLAMYDIA PROBE AMP ([place])    Imaging Review No results found.   EKG Interpretation None      MDM   Final diagnoses:  Concern about STD in female  without diagnosis  Yeast infection of the vagina    19 y.o. female with c/o STD exposure. No symptoms. Just started menses now therefore having vaginal bleeding but no discharge. Pelvic exam unremarkable, mild discharge which could be physiologic. No CMT or adnexal tenderness. Will empirically treat for STDs since she reports exposure. Will reassess shortly.  3:37 PM Wet prep showing few yeas and TNTC WBC. Will give diflucan now. U/A with some leuks but no bacteria and 0-2 WBC, doubt UTI. Discussed f[facility] for future STD concerns. Discussed that hospital will call for positive results. I explained the diagnosis and have given explicit precautions to return to the ER including for any other new or worsening symptoms. The patient  understands and accepts the medical plan as it's been dictated and I have answered their questions. Discharge instructions concerning home care and prescriptions have been given. The patient is STABLE and is discharged to home in good condition.   I personally performed the services described in this documentation, which was scribed in my presence. The recorded information has been reviewed and is accurate.  BP 113/75 mmHg  Pulse 76  Temp(Src) 98.1 F (36.7 C) (Oral)  Resp 19  SpO2 100%  Meds ordered this encounter  Medications  . azithromycin (ZITHROMAX) tablet 1,000 mg    Sig:    And  . cefTRIAXone (ROCEPHIN) injection 250 mg    Sig:     Order Specific Question:  Antibiotic Indication:    Answer:  STD  . fluconazole (DIFLUCAN) tablet 150 mg    Sig:       [place], PA-C 07/04/14 1547  Rolland Porter, MD 07/08/14 2205

## 2014-07-04 NOTE — ED Notes (Signed)
Pt on menstrual period.  

## 2014-07-04 NOTE — ED Notes (Signed)
Pt unable to void 

## 2014-07-04 NOTE — ED Notes (Signed)
Pt requesting STD check; pt sts told by sexual partner that she may have been exposed; pt denies any complaints

## 2014-07-05 LAB — HIV ANTIBODY (ROUTINE TESTING W REFLEX): HIV SCREEN 4TH GENERATION: NONREACTIVE

## 2014-07-05 LAB — RPR: RPR Ser Ql: NONREACTIVE

## 2014-07-05 LAB — GC/CHLAMYDIA PROBE AMP (~~LOC~~) NOT AT ARMC
Chlamydia: NEGATIVE
Neisseria Gonorrhea: NEGATIVE

## 2014-07-11 ENCOUNTER — Telehealth (HOSPITAL_COMMUNITY): Payer: Self-pay

## 2014-10-14 IMAGING — CR DG CHEST 2V
2 series · 2 of 2 positions shown · non-contrast
Comparison: 01/29/2014.

CLINICAL DATA: Chest pain for 3 days.

EXAM:
CHEST  2 VIEW

[w chest pa]
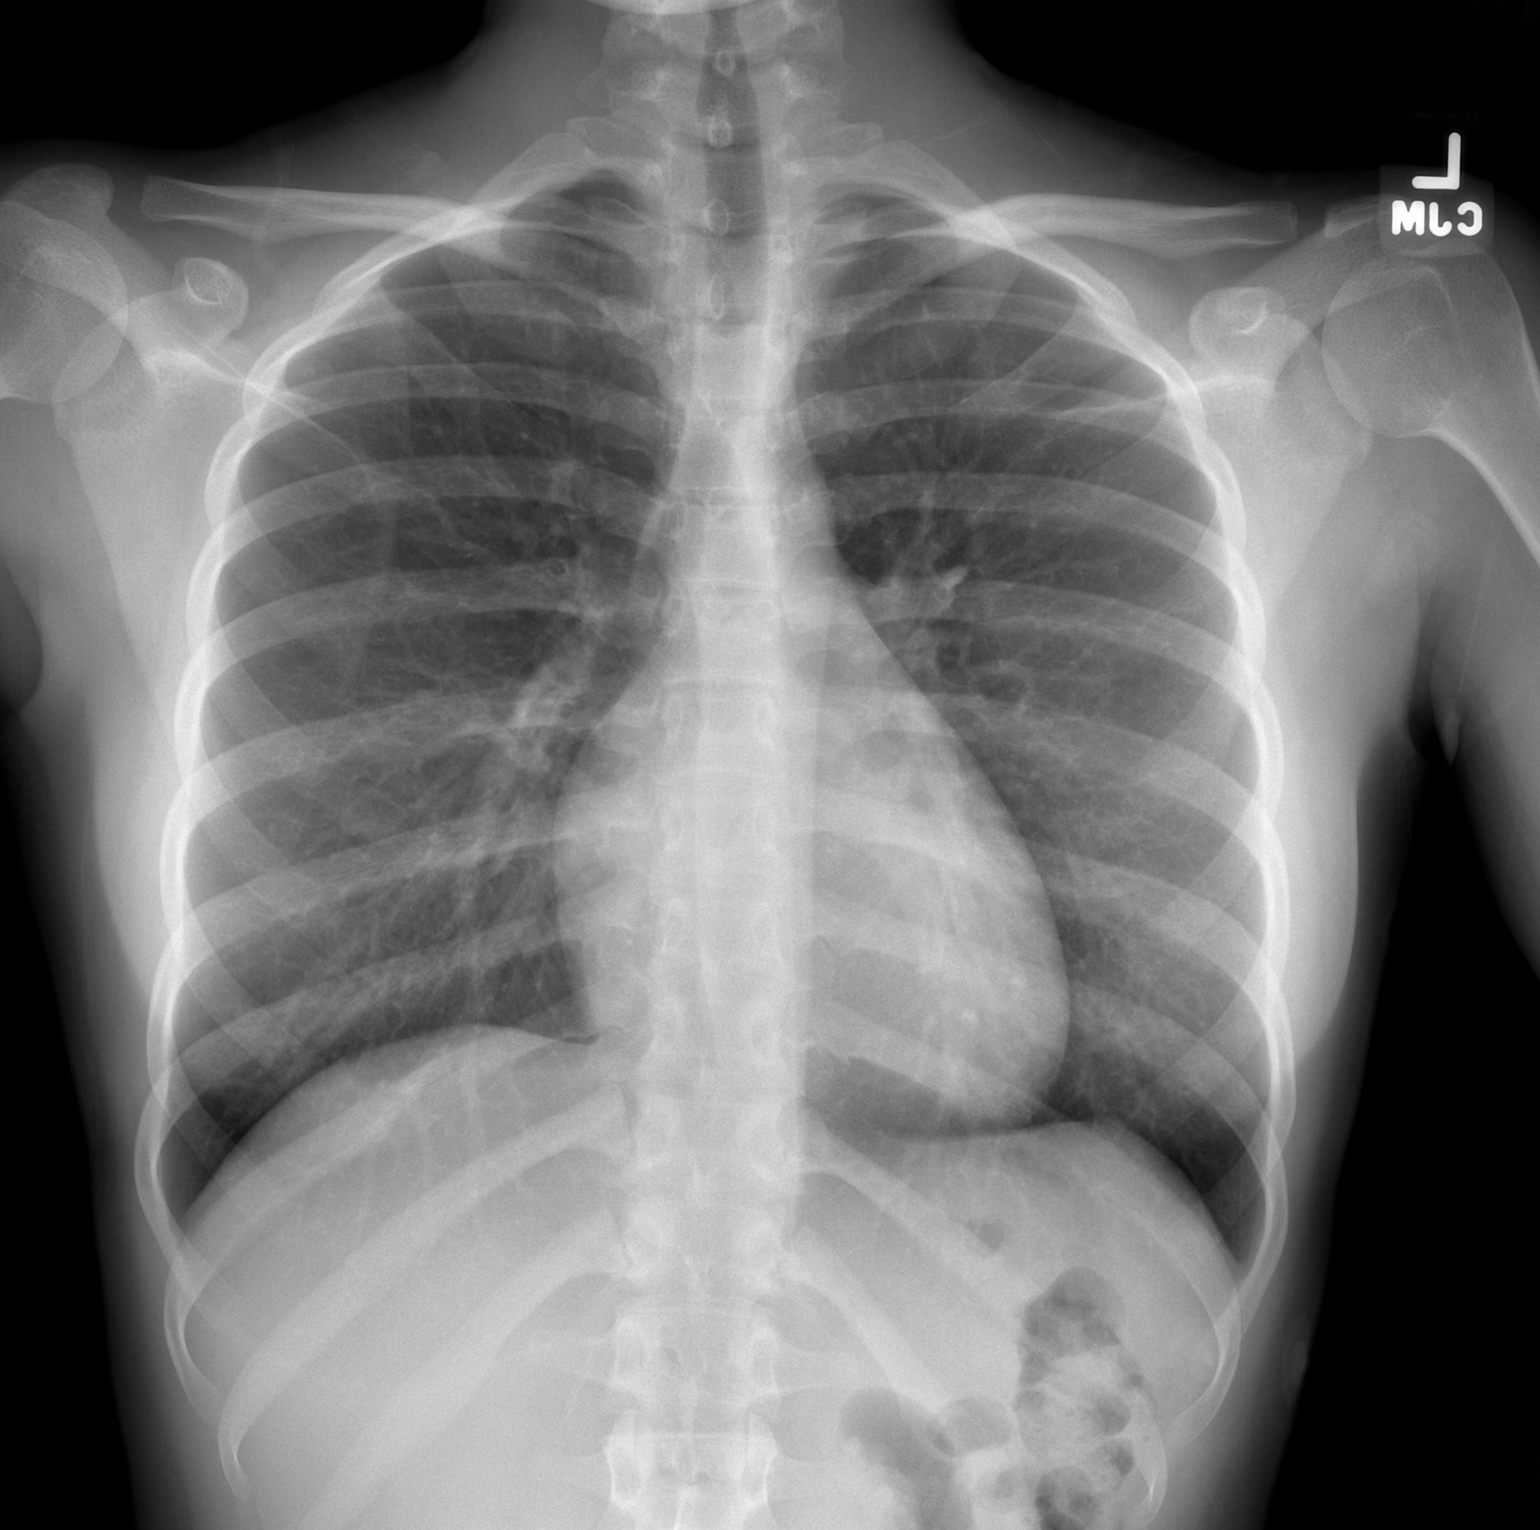

[w chest lat]
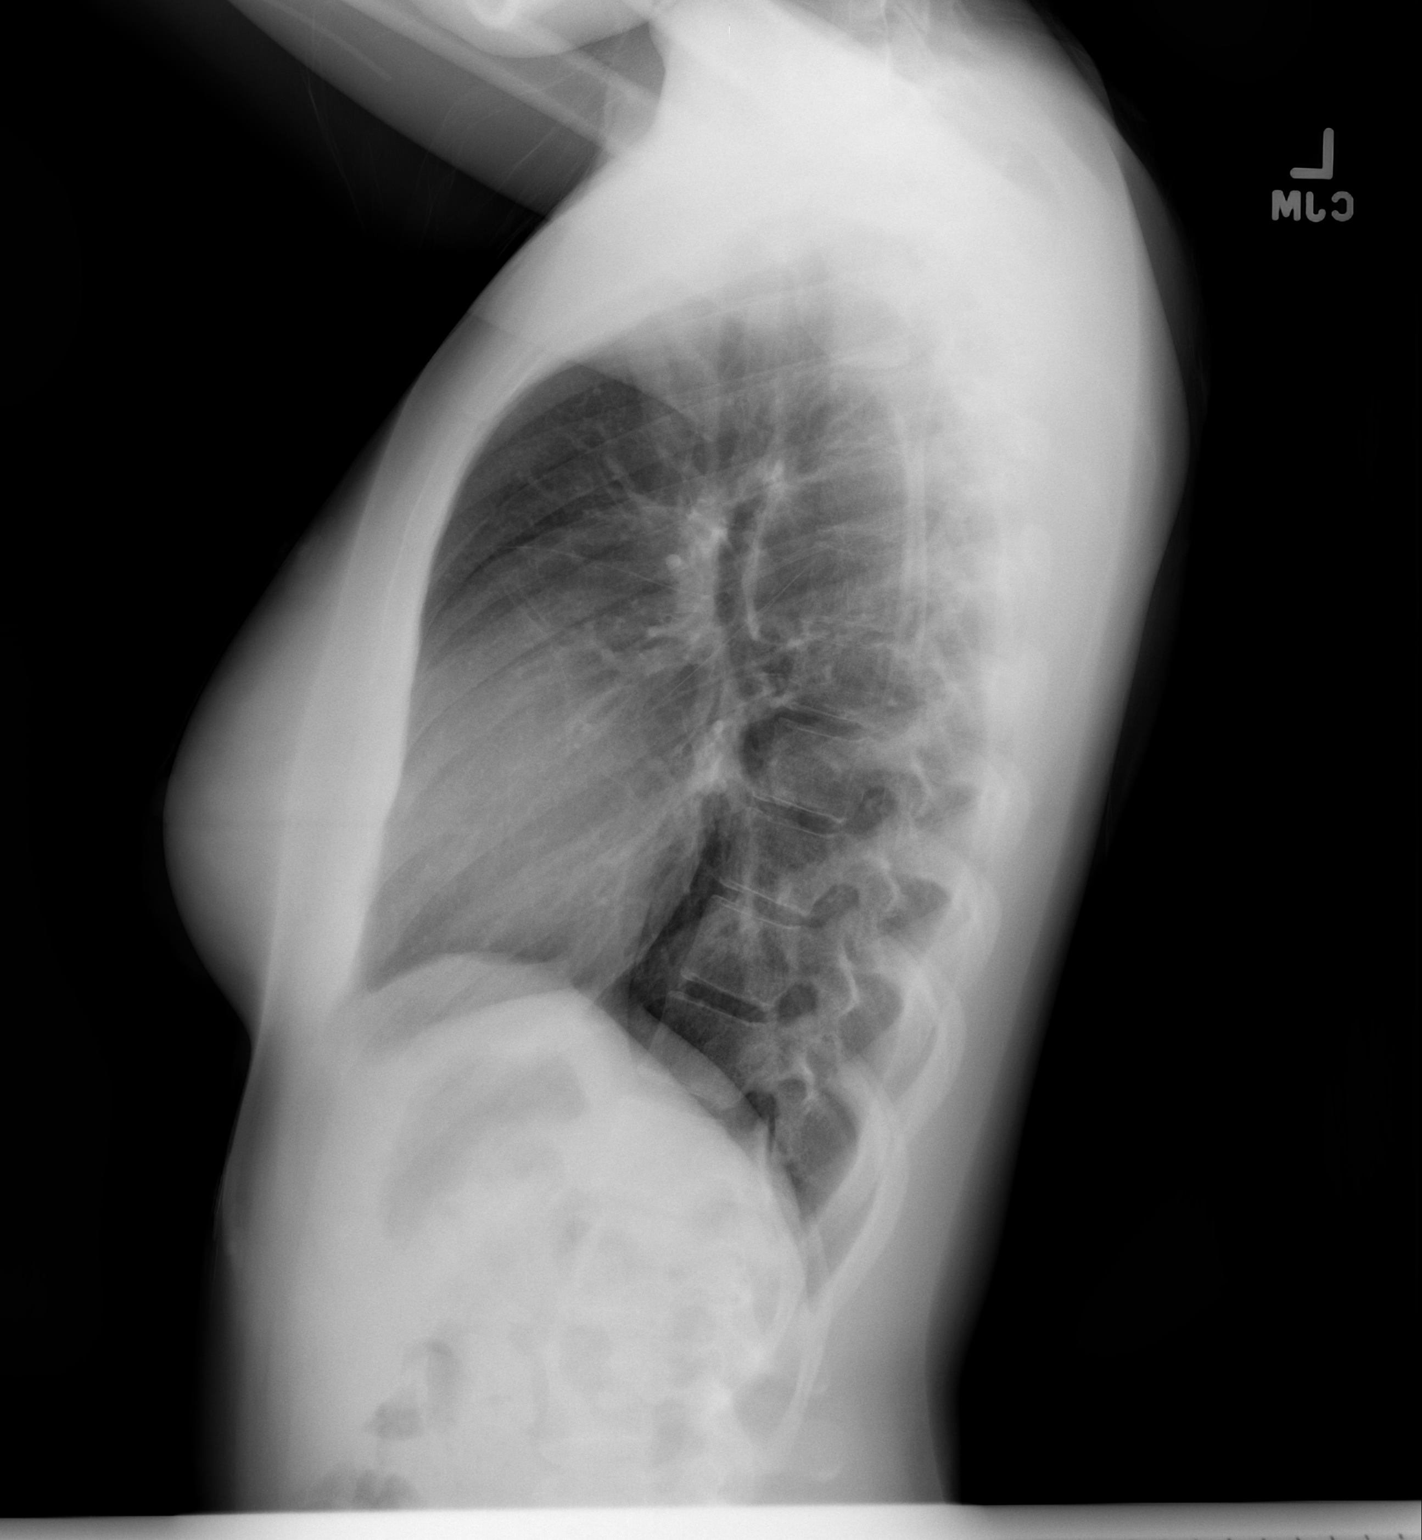

[2 of 2 positions shown; findings below may reference images not displayed]

FINDINGS: Cardiopericardial silhouette within normal limits. Mediastinal
contours normal. Trachea midline. No airspace disease or effusion.
IMPRESSION: No active cardiopulmonary disease.
